# Patient Record
Sex: Female | Born: 1985 | Hispanic: No | Marital: Single | Smoking: Never smoker
Health system: Southern US, Community
[De-identification: ages and names within clinical notes are randomized; demographics above are authoritative.]

---

## 2016-02-06 ENCOUNTER — Ambulatory Visit (INDEPENDENT_AMBULATORY_CARE_PROVIDER_SITE_OTHER): Payer: BLUE CROSS/BLUE SHIELD | Admitting: Emergency Medicine

## 2016-02-06 VITALS — BP 118/72 | HR 71 | Temp 97.9°F | Resp 18 | Ht 67.0 in | Wt 174.0 lb

## 2016-02-06 DIAGNOSIS — Z131 Encounter for screening for diabetes mellitus: Secondary | ICD-10-CM

## 2016-02-06 DIAGNOSIS — Z1329 Encounter for screening for other suspected endocrine disorder: Secondary | ICD-10-CM | POA: Diagnosis not present

## 2016-02-06 DIAGNOSIS — G44039 Episodic paroxysmal hemicrania, not intractable: Secondary | ICD-10-CM

## 2016-02-06 DIAGNOSIS — R05 Cough: Secondary | ICD-10-CM | POA: Diagnosis not present

## 2016-02-06 DIAGNOSIS — R059 Cough, unspecified: Secondary | ICD-10-CM

## 2016-02-06 LAB — GLUCOSE, POCT (MANUAL RESULT ENTRY): POC GLUCOSE: 110 mg/dL — AB (ref 70–99)

## 2016-02-06 LAB — COMPLETE METABOLIC PANEL WITH GFR
ALT: 11 U/L (ref 6–29)
AST: 17 U/L (ref 10–30)
Albumin: 4 g/dL (ref 3.6–5.1)
Alkaline Phosphatase: 71 U/L (ref 33–115)
BILIRUBIN TOTAL: 0.5 mg/dL (ref 0.2–1.2)
BUN: 11 mg/dL (ref 7–25)
CO2: 25 mmol/L (ref 20–31)
CREATININE: 0.64 mg/dL (ref 0.50–1.10)
Calcium: 8.9 mg/dL (ref 8.6–10.2)
Chloride: 105 mmol/L (ref 98–110)
GFR, Est Non African American: >89 mL/min (ref >=60)
GLUCOSE: 103 mg/dL — AB (ref 65–99)
Potassium: 4 mmol/L (ref 3.5–5.3)
SODIUM: 137 mmol/L (ref 135–146)
TOTAL PROTEIN: 8.3 g/dL — AB (ref 6.1–8.1)

## 2016-02-06 LAB — POCT CBC
Granulocyte percent: 56.9 %G (ref 37–80)
HEMATOCRIT: 34.4 % — AB (ref 37.7–47.9)
Hemoglobin: 12 g/dL — AB (ref 12.2–16.2)
LYMPH, POC: 1.4 (ref 0.6–3.4)
MCH, POC: 28.5 pg (ref 27–31.2)
MCHC: 34.7 g/dL (ref 31.8–35.4)
MCV: 82 fL (ref 80–97)
MID (cbc): 0.3 (ref 0–0.9)
MPV: 7.4 fL (ref 0–99.8)
POC GRANULOCYTE: 2.3 (ref 2–6.9)
POC LYMPH %: 35 % (ref 10–50)
POC MID %: 8.1 %M (ref 0–12)
Platelet Count, POC: 212 10*3/uL (ref 142–424)
RBC: 4.2 M/uL (ref 4.04–5.48)
RDW, POC: 13.4 %
WBC: 4 10*3/uL — AB (ref 4.6–10.2)

## 2016-02-06 LAB — TSH: TSH: 1.75 mIU/L

## 2016-02-06 NOTE — Patient Instructions (Addendum)
IF you received an x-ray today, you will receive an invoice from [facility]. Please contact [facility] at [phone] with questions or concerns regarding your invoice.   IF you received labwork today, you will receive an invoice from [employer]. Please contact [facility] at [phone] with questions or concerns regarding your invoice.   Our billing staff will not be able to assist you with questions regarding bills from these companies.  You will be contacted with the lab results as soon as they are available. The fastest way to get your results is to activate your My Chart account. Instructions are located on the last page of this paperwork. If you have not heard from [place] regarding the results in 2 weeks, please contact this office.     headache General Headache Without Cause A headache is pain or discomfort felt around the head or neck area. The specific cause of a headache may not be found. There are many causes and types of headaches. A few common ones are:  Tension headaches.  Migraine headaches.  Cluster headaches.  Chronic daily headaches. HOME CARE INSTRUCTIONS  Watch your condition for any changes. Take these steps to help with your condition: Managing Pain  Take over-the-counter and prescription medicines only as told by your health care provider.  Lie down in a dark, quiet room when you have a headache.  If directed, apply ice to the head and neck area:  Put ice in a plastic bag.  Place a towel between your skin and the bag.  Leave the ice on for 20 minutes, 2-3 times per day.  Use a heating pad or hot shower to apply heat to the head and neck area as told by your health care provider.  Keep lights dim if bright lights bother you or make your headaches worse. Eating and Drinking  Eat meals on a regular schedule.  Limit alcohol use.  Decrease the amount of caffeine you drink, or stop drinking  caffeine. General Instructions  Keep all follow-up visits as told by your health care provider. This is important.  Keep a headache journal to help find out what may trigger your headaches. For example, write down:  What you eat and drink.  How much sleep you get.  Any change to your diet or medicines.  Try massage or other relaxation techniques.  Limit stress.  Sit up straight, and do not tense your muscles.  Do not use tobacco products, including cigarettes, chewing tobacco, or e-cigarettes. If you need help quitting, ask your health care provider.  Exercise regularly as told by your health care provider.  Sleep on a regular schedule. Get 7-9 hours of sleep, or the amount recommended by your health care provider. SEEK MEDICAL CARE IF:   Your symptoms are not helped by medicine.  You have a headache that is different from the usual headache.  You have nausea or you vomit.  You have a fever. SEEK IMMEDIATE MEDICAL CARE IF:   Your headache becomes severe.  You have repeated vomiting.  You have a stiff neck.  You have a loss of vision.  You have problems with speech.  You have pain in the eye or ear.  You have muscular weakness or loss of muscle control.  You lose your balance or have trouble walking.  You feel faint or pass out.  You have confusion.   This information is not intended to replace advice given to you by your health care provider.  Make sure you discuss any questions you have with your health care provider.   Document Released: 06/21/2005 Document Revised: 03/12/2015 Document Reviewed: 10/14/2014 Elsevier Interactive Patient Education [employer].

## 2016-02-06 NOTE — Progress Notes (Signed)
Subjective:  This chart was scribed for Lesle Chris MD, by Veverly Fells, at [facility].  This patient was seen in room 2 and the patient's care was started at 10:35 AM.   Chief Complaint  Patient presents with  . Headache    with fatigue for years off and on      Patient ID: Brianna Pruitt, female    DOB: [birth year], 30 y.o.   MRN: [id number]  HPI HPI Comments: Brianna Pruitt is a 30 y.o. female who presents to the [facility] complaining of a "bad" headache with intermittent fatigue which has been going on for the past 5-6 years. Patient takes tylenol/advil when the pain comes on as she cannot stand light, louder noises or even being awake for long periods of time.  Usually the pain comes on in the morning with a "heavy" head.  Once the patients headaches start, they "do not stop" and can sometimes last for days to weeks.  Initially when the headache comes on, she has pain in the back of her head and feels like her heart starts beating faster.  She had a scan completed in [place] about 6-7 years ago but nothing of concern was shown. Patient does not use birth control as her husband is not [place] at the moment.  She is concerned about her diabetes as it runs in the family and patient does not restrain herself from eating sugar.     There are no active problems to display for this patient.  History reviewed. No pertinent past medical history. History reviewed. No pertinent surgical history. Allergies not on file Prior to Admission medications   Not on File   Social History   Social History  . Marital status: Single    Spouse name: N/A  . Number of children: N/A  . Years of education: N/A   Occupational History  . Not on file.   Social History Main Topics  . Smoking status: Never Smoker  . Smokeless tobacco: Never Used  . Alcohol use No  . Drug use: No  . Sexual activity: Not on file   Other Topics Concern  . Not on  file   Social History Narrative  . No narrative on file      Review of Systems  Constitutional: Positive for fatigue. Negative for chills and fever.  HENT: Negative for congestion.   Eyes: Positive for photophobia. Negative for discharge and itching.  Respiratory: Negative for cough, choking and shortness of breath.   Gastrointestinal: Negative for nausea and vomiting.  Neurological: Positive for headaches. Negative for seizures and speech difficulty.       Objective:   Physical Exam Vitals:   02/06/16 0945  BP: 118/72  Pulse: 71  Resp: 18  Temp: 97.9 F (36.6 C)  TempSrc: Oral  SpO2: 100%  Weight: 174 lb (78.9 kg)  Height: 5\' 7"  (1.702 m)     CONSTITUTIONAL: Well developed/well nourished HEAD: Normocephalic/atraumatic EYES: EOMI/PERRL ENMT: Mucous membranes moist NECK: supple no meningeal signs SPINE/BACK:entire spine nontender CV: S1/S2 noted, no murmurs/rubs/gallops noted LUNGS: Lungs are clear to auscultation bilaterally, no apparent distress NEURO: Pt is awake/alert/appropriate, moves all extremitiesx4.  No facial droop.   EXTREMITIES: pulses normal/equal, full ROM SKIN: warm, color normal PSYCH: no abnormalities of mood noted, alert and oriented to situation  Results for orders placed or performed in visit on 02/06/16  POCT CBC  Result Value Ref Range   WBC 4.0 (A)  4.6 - 10.2 K/uL   Lymph, poc 1.4 0.6 - 3.4   POC LYMPH PERCENT 35.0 10 - 50 %L   MID (cbc) 0.3 0 - 0.9   POC MID % 8.1 0 - 12 %M   POC Granulocyte 2.3 2 - 6.9   Granulocyte percent 56.9 37 - 80 %G   RBC 4.20 4.04 - 5.48 M/uL   Hemoglobin 12.0 (A) 12.2 - 16.2 g/dL   HCT, POC 40.9 (A) 81.1 - 47.9 %   MCV 82.0 80 - 97 fL   MCH, POC 28.5 27 - 31.2 pg   MCHC 34.7 31.8 - 35.4 g/dL   RDW, POC 91.4 %   Platelet Count, POC 212 142 - 424 K/uL   MPV 7.4 0 - 99.8 fL  POCT glucose (manual entry)  Result Value Ref Range   POC Glucose 110 (A) 70 - 99 mg/dl        Assessment & Plan:  Labs  look okay white count slightly low at 4000 with a hemoglobin 12 platelets normal. Other labs pending. Will refer to neurology for their opinion and hold off on treatment at time.I personally performed the services described in this documentation, which was scribed in my presence. The recorded information has been reviewed and is accurate.her husband is In [place] and she is currently not sexually active.  Collene Gobble, MD

## 2016-02-18 ENCOUNTER — Ambulatory Visit
Admission: RE | Admit: 2016-02-18 | Discharge: 2016-02-18 | Disposition: A | Discharge status: Home / Self Care | Payer: BLUE CROSS/BLUE SHIELD | Source: Ambulatory Visit | Attending: Emergency Medicine | Admitting: Emergency Medicine

## 2016-02-18 DIAGNOSIS — G44039 Episodic paroxysmal hemicrania, not intractable: Secondary | ICD-10-CM

## 2016-02-22 ENCOUNTER — Other Ambulatory Visit: Payer: Self-pay | Admitting: Physician Assistant

## 2016-02-22 DIAGNOSIS — R42 Dizziness and giddiness: Secondary | ICD-10-CM

## 2016-03-16 ENCOUNTER — Ambulatory Visit: Payer: BLUE CROSS/BLUE SHIELD | Admitting: Neurology

## 2016-03-18 ENCOUNTER — Encounter: Payer: Self-pay | Admitting: Neurology

## 2017-02-03 IMAGING — CT CT HEAD W/O CM
2 series · 16 of 40 positions shown, 20 images · non-contrast
Comparison: None.

CLINICAL DATA: Dizziness with persistent headaches for several
years.

EXAM:
CT HEAD WITHOUT CONTRAST
TECHNIQUE: Contiguous axial images were obtained from the base of the skull
through the vertex without intravenous contrast.

[Series 2: head w/(date) · axial · 0.45mm/px · z∈[-88,+47]mm · 13 of 33 slices shown, 17 images]
[im 3/33  brain]
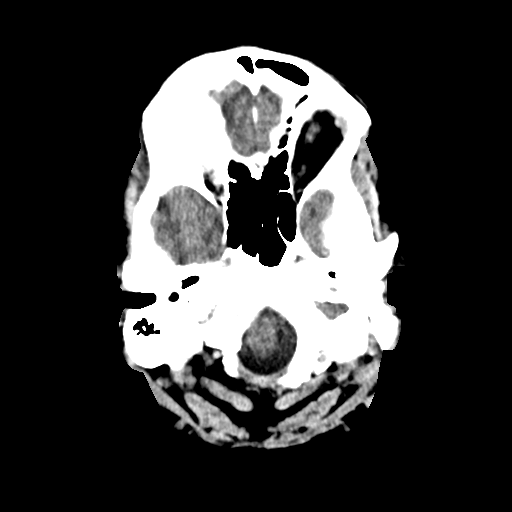
[im 3/33  bone]
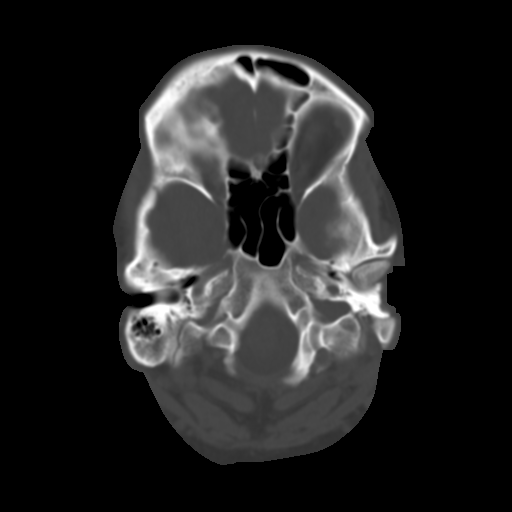
[im 5/33  brain]
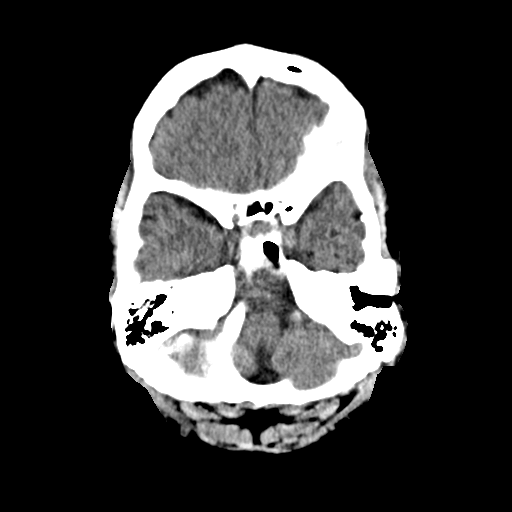
[im 7/33  brain]
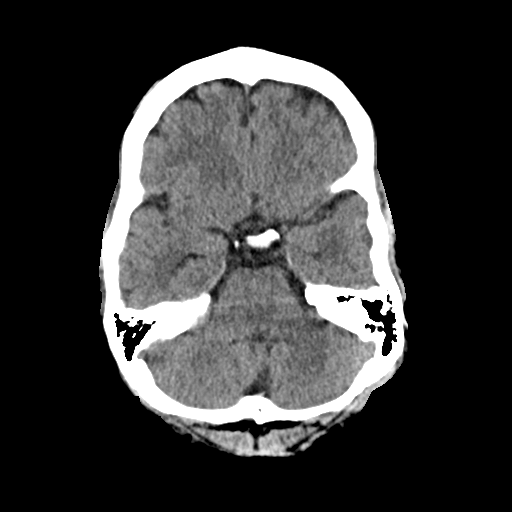
[im 9/33  brain]
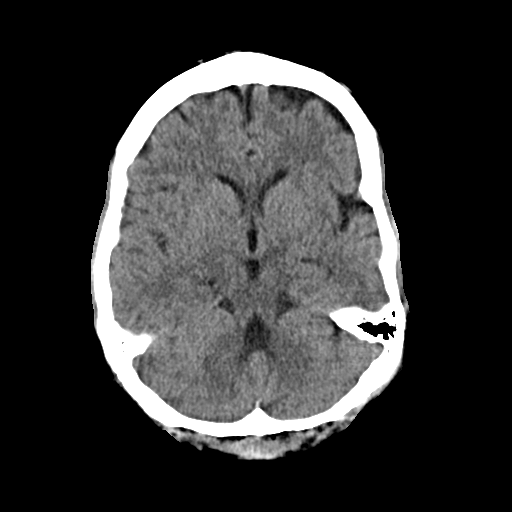
[im 12/33  brain]
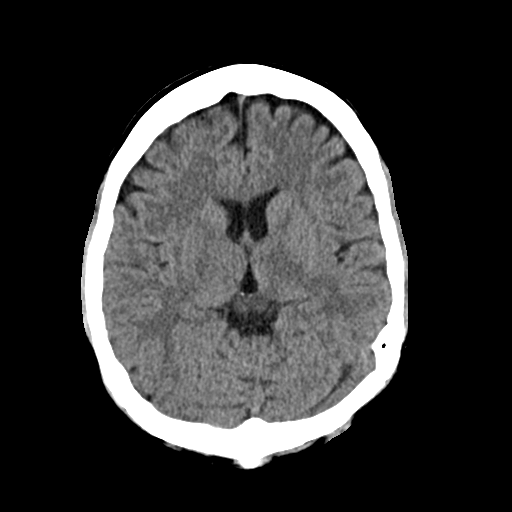
[im 12/33  bone]
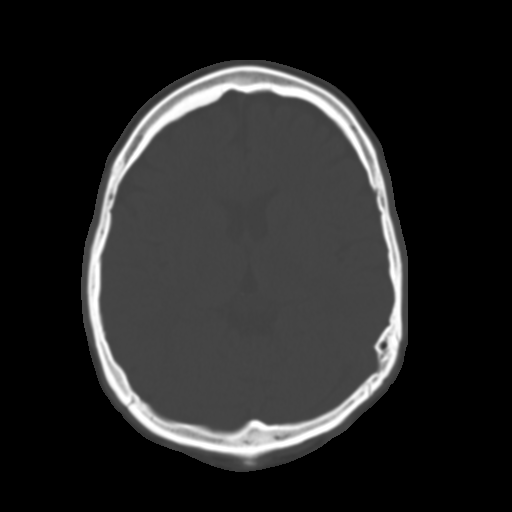
[im 14/33  brain]
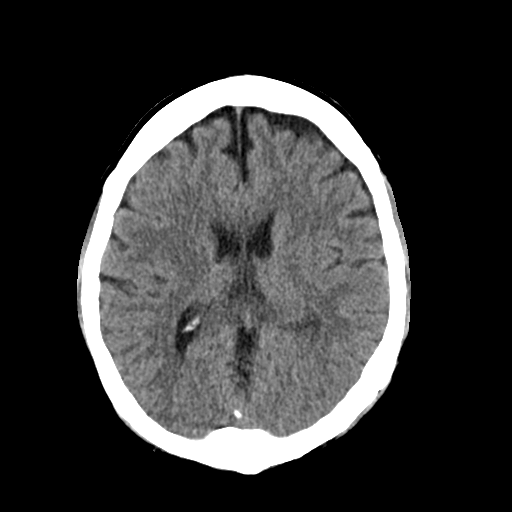
[im 17/33  brain]
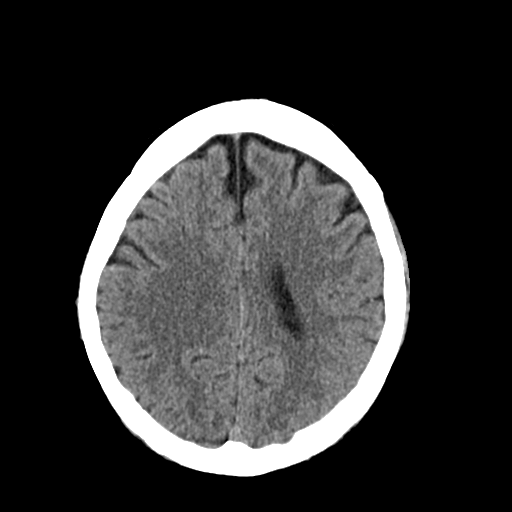
[im 19/33  brain]
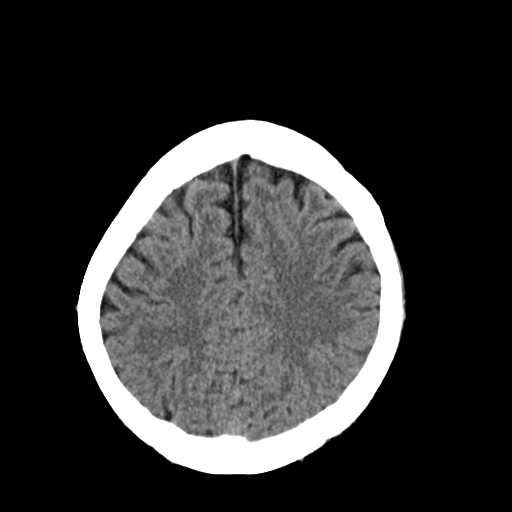
[im 21/33  brain]
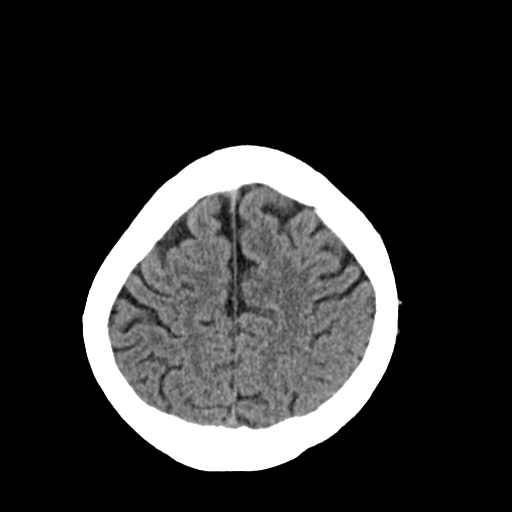
[im 21/33  bone]
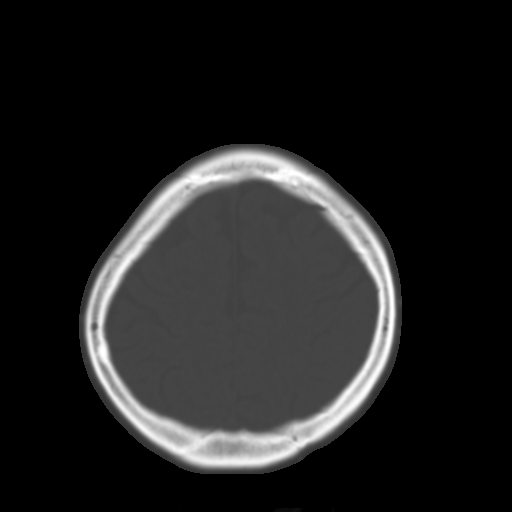
[im 24/33  brain]
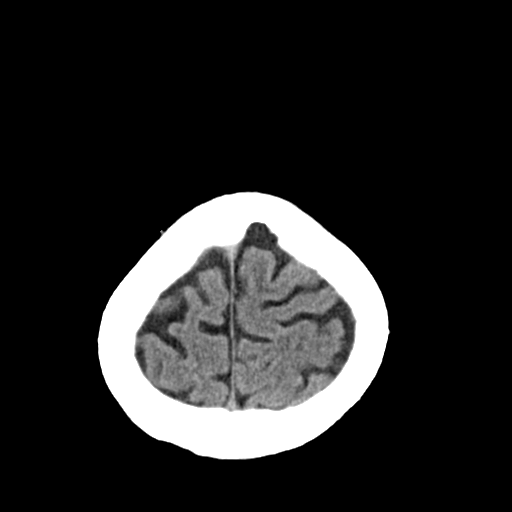
[im 26/33  brain]
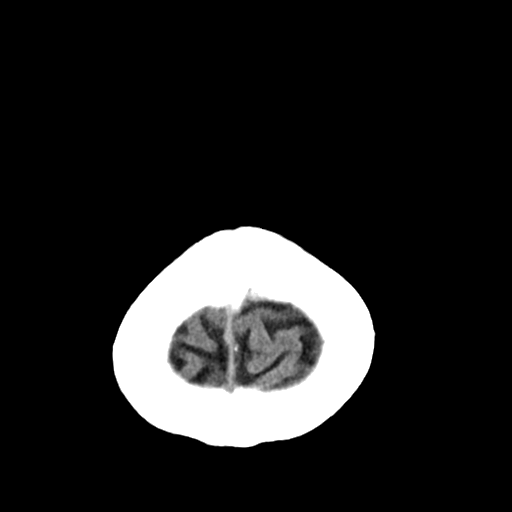
[im 28/33  brain]
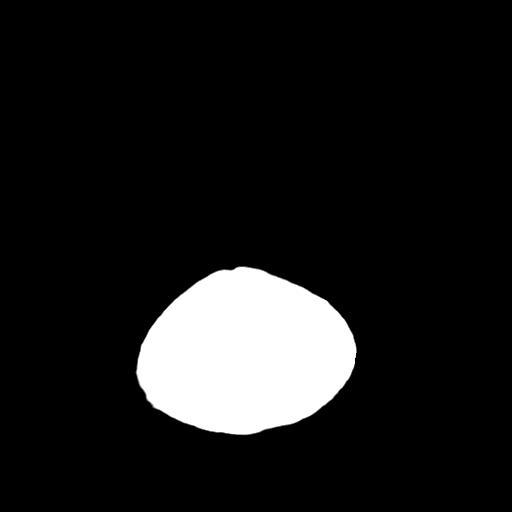
[im 30/33  brain]
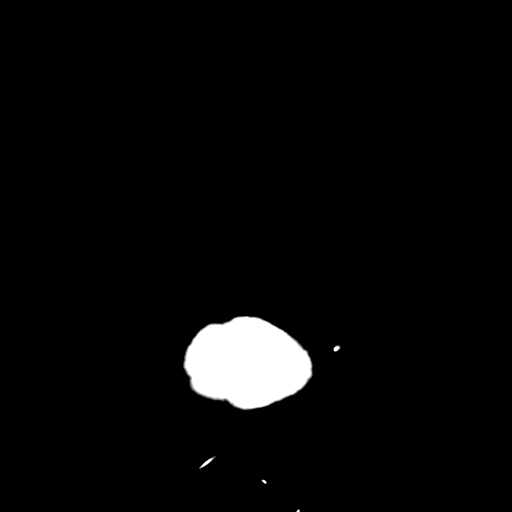
[im 30/33  bone]
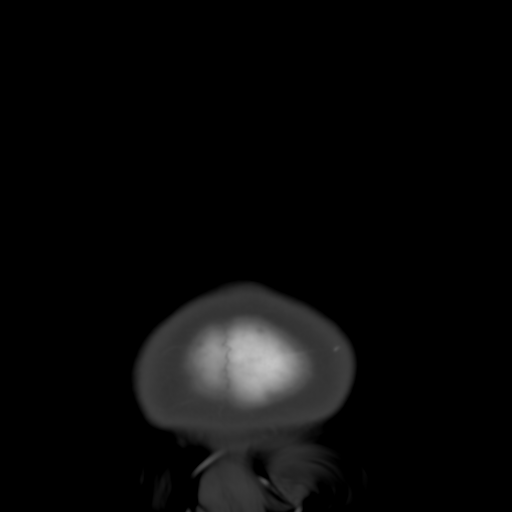

[Series 602: cor · coronal · 0.58mm/px · 3 of 94 slices shown]
[im 32/94  brain]
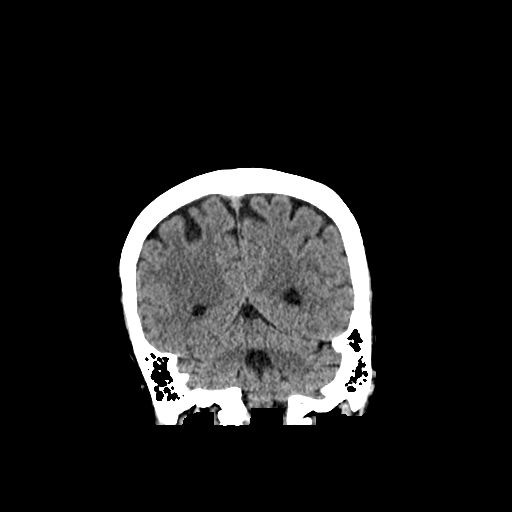
[im 42/94  brain]
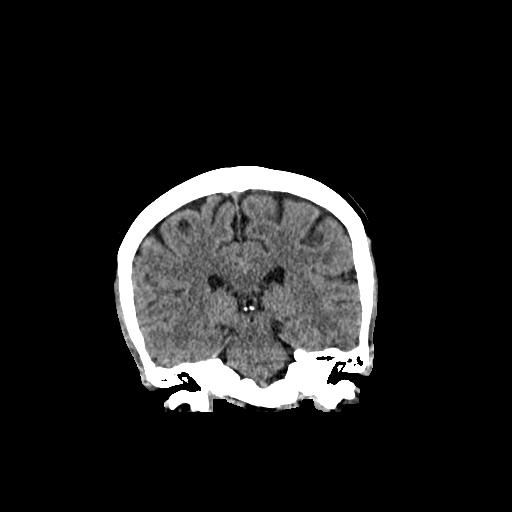
[im 52/94  brain]
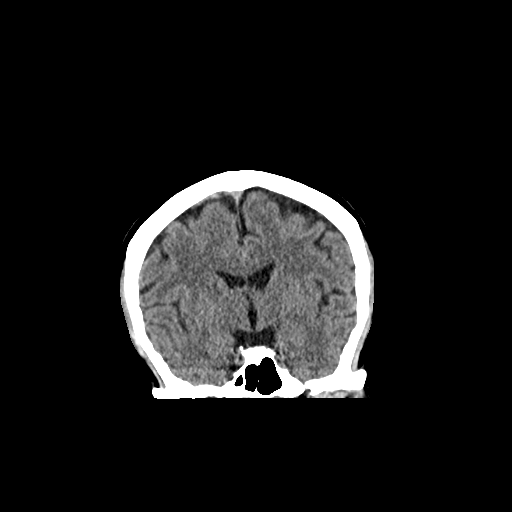

[16 of 40 positions shown; findings below may reference images not displayed]

FINDINGS: The brain has a normal appearance without evidence of malformation,
atrophy, old or acute infarction, mass lesion, hemorrhage,
hydrocephalus or extra-axial collection. The calvarium is
unremarkable. The paranasal sinuses, middle ears and mastoids are
clear.
IMPRESSION: Normal head CT
